# Patient Record
Sex: Female | Born: 1970
Health system: Southern US, Community
[De-identification: ages and names within clinical notes are randomized; demographics above are authoritative.]

## PROBLEM LIST (undated history)

## (undated) DIAGNOSIS — J45909 Unspecified asthma, uncomplicated: Secondary | ICD-10-CM

## (undated) DIAGNOSIS — T7840XA Allergy, unspecified, initial encounter: Secondary | ICD-10-CM

## (undated) DIAGNOSIS — J302 Other seasonal allergic rhinitis: Secondary | ICD-10-CM

## (undated) DIAGNOSIS — D573 Sickle-cell trait: Secondary | ICD-10-CM

## (undated) HISTORY — PX: BREAST BIOPSY: SHX20

## (undated) HISTORY — PX: NO PAST SURGERIES: SHX2092

## (undated) HISTORY — DX: Allergy, unspecified, initial encounter: T78.40XA

## (undated) HISTORY — DX: Unspecified asthma, uncomplicated: J45.909

## (undated) HISTORY — DX: Other seasonal allergic rhinitis: J30.2

## (undated) HISTORY — DX: Sickle-cell trait: D57.3

---

## 2020-02-22 ENCOUNTER — Telehealth: Payer: Self-pay | Admitting: Emergency Medicine

## 2020-02-22 NOTE — Telephone Encounter (Signed)
Copied from [place] [phone]. Topic: Appointment Scheduling - Scheduling Inquiry for Clinic >> Feb 22, 2020 12:06 PM Celene Kras wrote: Reason for CRM: Pt called and is requesting to set up a new patient appointment. Please advise .

## 2020-04-05 ENCOUNTER — Ambulatory Visit (INDEPENDENT_AMBULATORY_CARE_PROVIDER_SITE_OTHER): Payer: 59 | Admitting: Emergency Medicine

## 2020-04-05 ENCOUNTER — Telehealth: Payer: Self-pay | Admitting: *Deleted

## 2020-04-05 ENCOUNTER — Other Ambulatory Visit: Payer: Self-pay

## 2020-04-05 ENCOUNTER — Encounter: Payer: Self-pay | Admitting: Emergency Medicine

## 2020-04-05 ENCOUNTER — Encounter: Payer: Self-pay | Admitting: Internal Medicine

## 2020-04-05 VITALS — BP 112/73 | HR 60 | Temp 97.6°F | Resp 16 | Ht 71.0 in | Wt 193.0 lb

## 2020-04-05 DIAGNOSIS — H547 Unspecified visual loss: Secondary | ICD-10-CM

## 2020-04-05 DIAGNOSIS — Z1231 Encounter for screening mammogram for malignant neoplasm of breast: Secondary | ICD-10-CM

## 2020-04-05 DIAGNOSIS — Z7689 Persons encountering health services in other specified circumstances: Secondary | ICD-10-CM

## 2020-04-05 DIAGNOSIS — Z124 Encounter for screening for malignant neoplasm of cervix: Secondary | ICD-10-CM | POA: Diagnosis not present

## 2020-04-05 DIAGNOSIS — Z130 Encounter for screening for diseases of the blood and blood-forming organs and certain disorders involving the immune mechanism: Secondary | ICD-10-CM

## 2020-04-05 DIAGNOSIS — Z1211 Encounter for screening for malignant neoplasm of colon: Secondary | ICD-10-CM

## 2020-04-05 DIAGNOSIS — Z0000 Encounter for general adult medical examination without abnormal findings: Secondary | ICD-10-CM

## 2020-04-05 DIAGNOSIS — Z114 Encounter for screening for human immunodeficiency virus [HIV]: Secondary | ICD-10-CM

## 2020-04-05 DIAGNOSIS — Z13228 Encounter for screening for other metabolic disorders: Secondary | ICD-10-CM

## 2020-04-05 DIAGNOSIS — Z1159 Encounter for screening for other viral diseases: Secondary | ICD-10-CM

## 2020-04-05 DIAGNOSIS — Z13220 Encounter for screening for lipoid disorders: Secondary | ICD-10-CM

## 2020-04-05 DIAGNOSIS — Z1329 Encounter for screening for other suspected endocrine disorder: Secondary | ICD-10-CM

## 2020-04-05 DIAGNOSIS — Z1321 Encounter for screening for nutritional disorder: Secondary | ICD-10-CM

## 2020-04-05 NOTE — Progress Notes (Signed)
Elizabeth Boyer 49 y.o.   Chief Complaint  Patient presents with  . Establish Care    per pt just moved here 2 months from [facility]  . family history of htn    HISTORY OF PRESENT ILLNESS: This is a 49 y.o. female first visit to this office, here to establish care with me. Healthy female with a healthy lifestyle.  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  Recently moved to this area from [place]. No complaints or medical concerns today.  HPI   Prior to Admission medications   Not on File    Not on File  There are no problems to display for this patient.   History reviewed. No pertinent past medical history.  History reviewed. No pertinent surgical history.  Social History   Socioeconomic History  . Marital status: Legally Separated    Spouse name: Not on file  . Number of children: Not on file  . Years of education: Not on file  . Highest education level: Not on file  Occupational History  . Not on file  Tobacco Use  . Smoking status: Never Smoker  . Smokeless tobacco: Never Used  Substance and Sexual Activity  . Alcohol use: Not on file    Comment: once a month  . Drug use: Never  . Sexual activity: Not on file  Other Topics Concern  . Not on file  Social History Narrative  . Not on file   Social Determinants of Health   Financial Resource Strain:   . Difficulty of Paying Living Expenses:   Food Insecurity:   . Worried About Charity fundraiser in the Last Year:   . Arboriculturist in the Last Year:   Transportation Needs:   . Film/video editor (Medical):   Marland Kitchen Lack of Transportation (Non-Medical):   Physical Activity:   . Days of Exercise per Week:   . Minutes of Exercise per Session:   Stress:   . Feeling of Stress :   Social Connections:   . Frequency of Communication with Friends and Family:   . Frequency of Social Gatherings with Friends and Family:   . Attends Religious Services:   . Active Member of Clubs or Organizations:   . Attends  Archivist Meetings:   Marland Kitchen Marital Status:   Intimate Partner Violence:   . Fear of Current or Ex-Partner:   . Emotionally Abused:   Marland Kitchen Physically Abused:   . Sexually Abused:     History reviewed. No pertinent family history.   Review of Systems  Constitutional: Negative.  Negative for chills and fever.  HENT: Negative.  Negative for congestion and sore throat.   Respiratory: Negative.  Negative for cough and shortness of breath.   Cardiovascular: Negative.  Negative for chest pain and palpitations.  Gastrointestinal: Negative.  Negative for abdominal pain, diarrhea, nausea and vomiting.  Genitourinary: Negative.  Negative for dysuria and hematuria.  Musculoskeletal: Negative.  Negative for back pain, myalgias and neck pain.  Skin: Negative.  Negative for rash.  Neurological: Negative.  Negative for dizziness and headaches.  Endo/Heme/Allergies: Positive for environmental allergies.  All other systems reviewed and are negative.  Today's Vitals   04/05/20 0819  BP: 112/73  Pulse: 60  Resp: 16  Temp: 97.6 F (36.4 C)  TempSrc: Temporal  SpO2: 98%  Weight: 193 lb (87.5 kg)  Height: 5\' 11"  (1.803 m)   Body mass index is 26.92 kg/m.   Physical Exam Vitals reviewed.  Constitutional:      Appearance: Normal appearance.  HENT:     Head: Normocephalic.  Eyes:     Extraocular Movements: Extraocular movements intact.     Conjunctiva/sclera: Conjunctivae normal.     Pupils: Pupils are equal, round, and reactive to light.  Cardiovascular:     Rate and Rhythm: Normal rate and regular rhythm.     Pulses: Normal pulses.     Heart sounds: Normal heart sounds.  Pulmonary:     Effort: Pulmonary effort is normal.     Breath sounds: Normal breath sounds.  Musculoskeletal:        General: Normal range of motion.     Cervical back: Normal range of motion and neck supple.  Skin:    General: Skin is warm and dry.     Capillary Refill: Capillary refill takes less than 2  seconds.  Neurological:     General: No focal deficit present.     Mental Status: She is alert and oriented to person, place, and time.  Psychiatric:        Mood and Affect: Mood normal.        Behavior: Behavior normal.      ASSESSMENT & PLAN: Elizabeth Boyer was seen today for establish care and family history of htn.  Diagnoses and all orders for this visit:  Routine general medical examination at a health care facility  Encounter to establish care  Colon cancer screening -     Ambulatory referral to Gastroenterology -     Cologuard  Encounter for screening mammogram for malignant neoplasm of breast -     MM Digital Screening; Future  Cervical cancer screening -     Ambulatory referral to Obstetrics / Gynecology  Screening for deficiency anemia -     CBC with Differential/Platelet  Screening for lipoid disorders -     Lipid panel  Screening for endocrine, nutritional, metabolic and immunity disorder -     Comprehensive metabolic panel -     Hemoglobin A1c  Need for hepatitis C screening test -     Hepatitis C antibody  Screening for HIV (human immunodeficiency virus) -     HIV Antibody (routine testing w rflx)  Problems with sight -     Ambulatory referral to Optometry    Patient Instructions       If you have lab work done today you will be contacted with your lab results within the next 2 weeks.  If you have not heard from [place] then please contact us. The fastest way to get your results is to register for My Chart.   IF you received an x-ray today, you will receive an invoice from [facility]. Please contact [facility] at [phone] with questions or concerns regarding your invoice.   IF you received labwork today, you will receive an invoice from [facility]. Please contact [facility] at [phone] with questions or concerns regarding your invoice.   Our billing staff will not be able to assist you with questions regarding bills from these  companies.  You will be contacted with the lab results as soon as they are available. The fastest way to get your results is to activate your My Chart account. Instructions are located on the last page of this paperwork. If you have not heard from [place] regarding the results in 2 weeks, please contact this office.     Health Maintenance, Female Adopting a healthy lifestyle and getting preventive care are important in promoting health and  wellness. Ask your health care provider about:  The right schedule for you to have regular tests and exams.  Things you can do on your own to prevent diseases and keep yourself healthy. What should I know about diet, weight, and exercise? Eat a healthy diet   Eat a diet that includes plenty of vegetables, fruits, low-fat dairy products, and lean protein.  Do not eat a lot of foods that are high in solid fats, added sugars, or sodium. Maintain a healthy weight Body mass index (BMI) is used to identify weight problems. It estimates body fat based on height and weight. Your health care provider can help determine your BMI and help you achieve or maintain a healthy weight. Get regular exercise Get regular exercise. This is one of the most important things you can do for your health. Most adults should:  Exercise for at least 150 minutes each week. The exercise should increase your heart rate and make you sweat (moderate-intensity exercise).  Do strengthening exercises at least twice a week. This is in addition to the moderate-intensity exercise.  Spend less time sitting. Even light physical activity can be beneficial. Watch cholesterol and blood lipids Have your blood tested for lipids and cholesterol at 51 years of age, then have this test every 5 years. Have your cholesterol levels checked more often if:  Your lipid or cholesterol levels are high.  You are older than 51 years of age.  You are at high risk for heart disease. What should I know about  cancer screening? Depending on your health history and family history, you may need to have cancer screening at various ages. This may include screening for:  Breast cancer.  Cervical cancer.  Colorectal cancer.  Skin cancer.  Lung cancer. What should I know about heart disease, diabetes, and high blood pressure? Blood pressure and heart disease  High blood pressure causes heart disease and increases the risk of stroke. This is more likely to develop in people who have high blood pressure readings, are of African descent, or are overweight.  Have your blood pressure checked: ? Every 3-5 years if you are 30-54 years of age. ? Every year if you are 77 years old or older. Diabetes Have regular diabetes screenings. This checks your fasting blood sugar level. Have the screening done:  Once every three years after age 60 if you are at a normal weight and have a low risk for diabetes.  More often and at a younger age if you are overweight or have a high risk for diabetes. What should I know about preventing infection? Hepatitis B If you have a higher risk for hepatitis B, you should be screened for this virus. Talk with your health care provider to find out if you are at risk for hepatitis B infection. Hepatitis C Testing is recommended for:  Everyone born from [birth year] through 1965.  Anyone with known risk factors for hepatitis C. Sexually transmitted infections (STIs)  Get screened for STIs, including gonorrhea and chlamydia, if: ? You are sexually active and are younger than 51 years of age. ? You are older than 51 years of age and your health care provider tells you that you are at risk for this type of infection. ? Your sexual activity has changed since you were last screened, and you are at increased risk for chlamydia or gonorrhea. Ask your health care provider if you are at risk.  Ask your health care provider about whether you are at high risk  for HIV. Your health care  provider may recommend a prescription medicine to help prevent HIV infection. If you choose to take medicine to prevent HIV, you should first get tested for HIV. You should then be tested every 3 months for as long as you are taking the medicine. Pregnancy  If you are about to stop having your period (premenopausal) and you may become pregnant, seek counseling before you get pregnant.  Take 400 to 800 micrograms (mcg) of folic acid every day if you become pregnant.  Ask for birth control (contraception) if you want to prevent pregnancy. Osteoporosis and menopause Osteoporosis is a disease in which the bones lose minerals and strength with aging. This can result in bone fractures. If you are 40 years old or older, or if you are at risk for osteoporosis and fractures, ask your health care provider if you should:  Be screened for bone loss.  Take a calcium or vitamin D supplement to lower your risk of fractures.  Be given hormone replacement therapy (HRT) to treat symptoms of menopause. Follow these instructions at home: Lifestyle  Do not use any products that contain nicotine or tobacco, such as cigarettes, e-cigarettes, and chewing tobacco. If you need help quitting, ask your health care provider.  Do not use street drugs.  Do not share needles.  Ask your health care provider for help if you need support or information about quitting drugs. Alcohol use  Do not drink alcohol if: ? Your health care provider tells you not to drink. ? You are pregnant, may be pregnant, or are planning to become pregnant.  If you drink alcohol: ? Limit how much you use to 0-1 drink a day. ? Limit intake if you are breastfeeding.  Be aware of how much alcohol is in your drink. In the [place] one drink equals one 12 oz bottle of beer (355 mL), one 5 oz glass of wine (148 mL), or one 1 oz glass of hard liquor (44 mL). General instructions  Schedule regular health, dental, and eye exams.  Stay current  with your vaccines.  Tell your health care provider if: ? You often feel depressed. ? You have ever been abused or do not feel safe at home. Summary  Adopting a healthy lifestyle and getting preventive care are important in promoting health and wellness.  Follow your health care provider's instructions about healthy diet, exercising, and getting tested or screened for diseases.  Follow your health care provider's instructions on monitoring your cholesterol and blood pressure. This information is not intended to replace advice given to you by your health care provider. Make sure you discuss any questions you have with your health care provider. Document Revised: 09/08/2018 Document Reviewed: 09/08/2018 Elsevier Patient Education  2020 Elsevier Inc.      Agustina Caroli, MD [facility]

## 2020-04-05 NOTE — Patient Instructions (Addendum)
   If you have lab work done today you will be contacted with your lab results within the next 2 weeks.  If you have not heard from us then please contact us. The fastest way to get your results is to register for My Chart.   IF you received an x-ray today, you will receive an invoice from [facility]. Please contact [facility] at [phone] with questions or concerns regarding your invoice.   IF you received labwork today, you will receive an invoice from [facility]. Please contact [facility] at [phone] with questions or concerns regarding your invoice.   Our billing staff will not be able to assist you with questions regarding bills from these companies.  You will be contacted with the lab results as soon as they are available. The fastest way to get your results is to activate your My Chart account. Instructions are located on the last page of this paperwork. If you have not heard from us regarding the results in 2 weeks, please contact this office.       Health Maintenance, Female Adopting a healthy lifestyle and getting preventive care are important in promoting health and wellness. Ask your health care provider about:  The right schedule for you to have regular tests and exams.  Things you can do on your own to prevent diseases and keep yourself healthy. What should I know about diet, weight, and exercise? Eat a healthy diet   Eat a diet that includes plenty of vegetables, fruits, low-fat dairy products, and lean protein.  Do not eat a lot of foods that are high in solid fats, added sugars, or sodium. Maintain a healthy weight Body mass index (BMI) is used to identify weight problems. It estimates body fat based on height and weight. Your health care provider can help determine your BMI and help you achieve or maintain a healthy weight. Get regular exercise Get regular exercise. This is one of the most important things you can do for your health. Most  adults should:  Exercise for at least 150 minutes each week. The exercise should increase your heart rate and make you sweat (moderate-intensity exercise).  Do strengthening exercises at least twice a week. This is in addition to the moderate-intensity exercise.  Spend less time sitting. Even light physical activity can be beneficial. Watch cholesterol and blood lipids Have your blood tested for lipids and cholesterol at 51 years of age, then have this test every 5 years. Have your cholesterol levels checked more often if:  Your lipid or cholesterol levels are high.  You are older than 51 years of age.  You are at high risk for heart disease. What should I know about cancer screening? Depending on your health history and family history, you may need to have cancer screening at various ages. This may include screening for:  Breast cancer.  Cervical cancer.  Colorectal cancer.  Skin cancer.  Lung cancer. What should I know about heart disease, diabetes, and high blood pressure? Blood pressure and heart disease  High blood pressure causes heart disease and increases the risk of stroke. This is more likely to develop in people who have high blood pressure readings, are of African descent, or are overweight.  Have your blood pressure checked: ? Every 3-5 years if you are 18-39 years of age. ? Every year if you are 40 years old or older. Diabetes Have regular diabetes screenings. This checks your fasting blood sugar level. Have the screening done:  Once   every three years after age 40 if you are at a normal weight and have a low risk for diabetes.  More often and at a younger age if you are overweight or have a high risk for diabetes. What should I know about preventing infection? Hepatitis B If you have a higher risk for hepatitis B, you should be screened for this virus. Talk with your health care provider to find out if you are at risk for hepatitis B infection. Hepatitis  C Testing is recommended for:  Everyone born from 1945 through 1965.  Anyone with known risk factors for hepatitis C. Sexually transmitted infections (STIs)  Get screened for STIs, including gonorrhea and chlamydia, if: ? You are sexually active and are younger than 51 years of age. ? You are older than 51 years of age and your health care provider tells you that you are at risk for this type of infection. ? Your sexual activity has changed since you were last screened, and you are at increased risk for chlamydia or gonorrhea. Ask your health care provider if you are at risk.  Ask your health care provider about whether you are at high risk for HIV. Your health care provider may recommend a prescription medicine to help prevent HIV infection. If you choose to take medicine to prevent HIV, you should first get tested for HIV. You should then be tested every 3 months for as long as you are taking the medicine. Pregnancy  If you are about to stop having your period (premenopausal) and you may become pregnant, seek counseling before you get pregnant.  Take 400 to 800 micrograms (mcg) of folic acid every day if you become pregnant.  Ask for birth control (contraception) if you want to prevent pregnancy. Osteoporosis and menopause Osteoporosis is a disease in which the bones lose minerals and strength with aging. This can result in bone fractures. If you are 65 years old or older, or if you are at risk for osteoporosis and fractures, ask your health care provider if you should:  Be screened for bone loss.  Take a calcium or vitamin D supplement to lower your risk of fractures.  Be given hormone replacement therapy (HRT) to treat symptoms of menopause. Follow these instructions at home: Lifestyle  Do not use any products that contain nicotine or tobacco, such as cigarettes, e-cigarettes, and chewing tobacco. If you need help quitting, ask your health care provider.  Do not use street  drugs.  Do not share needles.  Ask your health care provider for help if you need support or information about quitting drugs. Alcohol use  Do not drink alcohol if: ? Your health care provider tells you not to drink. ? You are pregnant, may be pregnant, or are planning to become pregnant.  If you drink alcohol: ? Limit how much you use to 0-1 drink a day. ? Limit intake if you are breastfeeding.  Be aware of how much alcohol is in your drink. In the [place] one drink equals one 12 oz bottle of beer (355 mL), one 5 oz glass of wine (148 mL), or one 1 oz glass of hard liquor (44 mL). General instructions  Schedule regular health, dental, and eye exams.  Stay current with your vaccines.  Tell your health care provider if: ? You often feel depressed. ? You have ever been abused or do not feel safe at home. Summary  Adopting a healthy lifestyle and getting preventive care are important in promoting health and   wellness.  Follow your health care provider's instructions about healthy diet, exercising, and getting tested or screened for diseases.  Follow your health care provider's instructions on monitoring your cholesterol and blood pressure. This information is not intended to replace advice given to you by your health care provider. Make sure you discuss any questions you have with your health care provider. Document Revised: 09/08/2018 Document Reviewed: 09/08/2018 Elsevier Patient Education  2020 Elsevier Inc.  

## 2020-04-05 NOTE — Telephone Encounter (Signed)
Faxed Cologuard requisition to [facility]. confirmation page 8:52 am.

## 2020-04-06 LAB — CBC WITH DIFFERENTIAL/PLATELET
Basophils Absolute: 0 10*3/uL (ref 0.0–0.2)
Basos: 1 %
EOS (ABSOLUTE): 0.3 10*3/uL (ref 0.0–0.4)
Eos: 6 %
Hematocrit: 37.1 % (ref 34.0–46.6)
Hemoglobin: 12 g/dL (ref 11.1–15.9)
Immature Grans (Abs): 0 10*3/uL (ref 0.0–0.1)
Immature Granulocytes: 0 %
Lymphocytes Absolute: 1.8 10*3/uL (ref 0.7–3.1)
Lymphs: 37 %
MCH: 27.5 pg (ref 26.6–33.0)
MCHC: 32.3 g/dL (ref 31.5–35.7)
MCV: 85 fL (ref 79–97)
Monocytes Absolute: 0.4 10*3/uL (ref 0.1–0.9)
Monocytes: 8 %
Neutrophils Absolute: 2.5 10*3/uL (ref 1.4–7.0)
Neutrophils: 48 %
Platelets: 237 10*3/uL (ref 150–450)
RBC: 4.36 x10E6/uL (ref 3.77–5.28)
RDW: 14.4 % (ref 11.7–15.4)
WBC: 5 10*3/uL (ref 3.4–10.8)

## 2020-04-06 LAB — LIPID PANEL
Chol/HDL Ratio: 3.6 ratio (ref 0.0–4.4)
Cholesterol, Total: 237 mg/dL — ABNORMAL HIGH (ref 100–199)
HDL: 65 mg/dL (ref >39)
LDL Chol Calc (NIH): 156 mg/dL — ABNORMAL HIGH (ref 0–99)
Triglycerides: 91 mg/dL (ref 0–149)
VLDL Cholesterol Cal: 16 mg/dL (ref 5–40)

## 2020-04-06 LAB — HEMOGLOBIN A1C
Est. average glucose Bld gHb Est-mCnc: 114 mg/dL
Hgb A1c MFr Bld: 5.6 % (ref 4.8–5.6)

## 2020-04-06 LAB — COMPREHENSIVE METABOLIC PANEL
ALT: 20 IU/L (ref 0–32)
AST: 16 IU/L (ref 0–40)
Albumin/Globulin Ratio: 1.5 (ref 1.2–2.2)
Albumin: 4.1 g/dL (ref 3.8–4.8)
Alkaline Phosphatase: 61 IU/L (ref 48–121)
BUN/Creatinine Ratio: 12 (ref 9–23)
BUN: 11 mg/dL (ref 6–24)
Bilirubin Total: 0.3 mg/dL (ref 0.0–1.2)
CO2: 26 mmol/L (ref 20–29)
Calcium: 9.5 mg/dL (ref 8.7–10.2)
Chloride: 105 mmol/L (ref 96–106)
Creatinine, Ser: 0.95 mg/dL (ref 0.57–1.00)
GFR calc Af Amer: 81 mL/min/{1.73_m2} (ref >59)
GFR calc non Af Amer: 70 mL/min/{1.73_m2} (ref >59)
Globulin, Total: 2.8 g/dL (ref 1.5–4.5)
Glucose: 94 mg/dL (ref 65–99)
Potassium: 4 mmol/L (ref 3.5–5.2)
Sodium: 142 mmol/L (ref 134–144)
Total Protein: 6.9 g/dL (ref 6.0–8.5)

## 2020-04-06 LAB — HEPATITIS C ANTIBODY: Hep C Virus Ab: <0.1 s/co ratio (ref 0.0–0.9)

## 2020-04-06 LAB — HIV ANTIBODY (ROUTINE TESTING W REFLEX): HIV Screen 4th Generation wRfx: NONREACTIVE

## 2020-04-27 LAB — COLOGUARD: Cologuard: NEGATIVE

## 2020-05-29 ENCOUNTER — Encounter: Payer: 59 | Admitting: Internal Medicine

## 2020-06-22 ENCOUNTER — Telehealth: Payer: Self-pay | Admitting: *Deleted

## 2020-06-22 NOTE — Telephone Encounter (Signed)
Schedule mobile mammogram 06-25-2020 primary care at [facility]

## 2020-06-28 ENCOUNTER — Other Ambulatory Visit: Payer: Self-pay

## 2020-06-28 ENCOUNTER — Ambulatory Visit (INDEPENDENT_AMBULATORY_CARE_PROVIDER_SITE_OTHER): Payer: 59 | Admitting: Obstetrics and Gynecology

## 2020-06-28 ENCOUNTER — Other Ambulatory Visit (HOSPITAL_COMMUNITY)
Admission: RE | Admit: 2020-06-28 | Discharge: 2020-06-28 | Disposition: A | Discharge status: Home / Self Care | Payer: 59 | Source: Ambulatory Visit | Attending: Obstetrics and Gynecology | Admitting: Obstetrics and Gynecology

## 2020-06-28 ENCOUNTER — Encounter: Payer: Self-pay | Admitting: Obstetrics and Gynecology

## 2020-06-28 VITALS — BP 111/79 | HR 64 | Wt 198.0 lb

## 2020-06-28 DIAGNOSIS — R350 Frequency of micturition: Secondary | ICD-10-CM

## 2020-06-28 DIAGNOSIS — Z01419 Encounter for gynecological examination (general) (routine) without abnormal findings: Secondary | ICD-10-CM | POA: Diagnosis not present

## 2020-06-28 NOTE — Progress Notes (Signed)
GYNECOLOGY ANNUAL PREVENTATIVE CARE ENCOUNTER NOTE  History:     Elizabeth Boyer is a 49 y.o. G51P0012 female here for a routine annual gynecologic exam.  Current complaints: none.   Denies abnormal vaginal bleeding, discharge, pelvic pain, problems with intercourse or other gynecologic concerns.   Pt has history of hot flashes, but no longer.  Pt notes urinary frequency for the past 2 weeks, no dysuria.  No nocturia.   Gynecologic History No LMP recorded. (Menstrual status: Irregular Periods).  04/2020 Contraception: none Last Pap: pt does not remember. Pt denies history of abnormal pap Last mammogram: [place] 2013. Results were: found a cyst and pt had biopsy, was benign  Obstetric History OB History  Gravida Para Term Preterm AB Living  3       1 2   SAB TAB Ectopic Multiple Live Births    1     2    # Outcome Date GA Lbr Len/2nd Weight Sex Delivery Anes PTL Lv  3 Gravida           2 Gravida           1 TAB             Past Medical History:  Diagnosis Date  . Seasonal allergies     No past surgical history on file.  Current Outpatient Medications on File Prior to Visit  Medication Sig Dispense Refill  . Multiple Vitamin (MULTIVITAMIN) tablet Take 1 tablet by mouth as needed.     No current facility-administered medications on file prior to visit.    Allergies  Allergen Reactions  . Shellfish Allergy Swelling  . Benadryl [Diphenhydramine] Other (See Comments)    Unable to breath and causes asthma    Social History:  reports that she has never smoked. She has never used smokeless tobacco. She reports that she does not use drugs.  Family History  Problem Relation Age of Onset  . Hypertension Mother     The following portions of the patient's history were reviewed and updated as appropriate: allergies, current medications, past family history, past medical history, past social history, past surgical history and problem list.  Review of Systems Pertinent items  noted in HPI and remainder of comprehensive ROS otherwise negative.  Physical Exam:  BP 111/79   Pulse 64   Wt 198 lb (89.8 kg)   BMI 27.62 kg/m  CONSTITUTIONAL: Well-developed, well-nourished female in no acute distress.  HENT:  Normocephalic, atraumatic, External right and left ear normal. Oropharynx is clear and moist EYES: Conjunctivae and EOM are normal. Pupils are equal, round, and reactive to light. No scleral icterus.  NECK: Normal range of motion, supple, no masses.  Normal thyroid.  SKIN: Skin is warm and dry. No rash noted. Not diaphoretic. No erythema. No pallor. MUSCULOSKELETAL: Normal range of motion. No tenderness.  No cyanosis, clubbing, or edema.  2+ distal pulses. NEUROLOGIC: Alert and oriented to person, place, and time. Normal reflexes, muscle tone coordination.  PSYCHIATRIC: Normal mood and affect. Normal behavior. Normal judgment and thought content. CARDIOVASCULAR: Normal heart rate noted, regular rhythm RESPIRATORY: Clear to auscultation bilaterally. Effort and breath sounds normal, no problems with respiration noted. BREASTS: Symmetric in size. No masses, tenderness, skin changes, nipple drainage, or lymphadenopathy bilaterally. Performed in the presence of a chaperone. ABDOMEN: Soft, no distention noted.  No tenderness, rebound or guarding.  PELVIC: Normal appearing external genitalia and urethral meatus; normal appearing vaginal mucosa and cervix.  No abnormal discharge noted.  Pap smear obtained.  Normal uterine size, uterus very retroflexed and the cervix is anterior  no other palpable masses, no uterine or adnexal tenderness.  Performed in the presence of a chaperone.   Assessment and Plan:    1. Encounter for annual routine gynecological examination Normal annual exam, pt appears to be transitioning well into menopause, she has never had a 12 month unbroken amenorrhea, will continue to follow, check ua and urine culture for urinary frequency - Cytology - PAP(  [place])  Will follow up results of pap smear and manage accordingly. Mammogram scheduled by PCP Routine preventative health maintenance measures emphasized. Please refer to After Visit Summary for other counseling recommendations.      Lynnda Shields, MD, [place] [facility], [place]

## 2020-06-28 NOTE — Patient Instructions (Signed)
Preventing Cervical Cancer Cervical cancer is cancer that grows on the cervix. The cervix is at the bottom of the uterus. It connects the uterus to the vagina. The uterus is where a baby develops during pregnancy. Cancer occurs when cells become abnormal and start to grow out of control. If cervical cancer is not found early, it can spread and become dangerous. Cervical cancer cannot always be prevented, but you can take steps to lower your risk of developing this condition. How can this condition affect me? Cervical cancer grows slowly and may not cause any symptoms at first. Over time, the cancer can grow deep into the cervix tissue and spread to other areas. This may take years, and it may happen without you knowing about it. If it is found early, cervical cancer can be treated effectively. If the cancer has grown deep into your cervix or has spread, it will be more difficult to treat. Most cases of cervical cancer are caused by an STI (sexually transmitted infection) called human papillomavirus (HPV). One way to reduce your risk of cervical cancer is to take steps to avoid infection with the HPV virus. Getting regular Pap tests is also important because this can help identify changes in cells that could lead to cancer. Your chances of getting this disease can also be reduced by making certain lifestyle changes. What can increase my risk? You are more likely to develop this condition if:  You have certain things in your sexual history, such as: ? Having a sexually transmitted viral infection. These include chlamydia and herpes. ? Having more than one sexual partner, or having sex with someone who has more than one sexual partner. ? Not using condoms during sex. ? Having been sexually active before the age of 28.  Your mother took a medicine called diethylstilbestrol (DES) while pregnant with you, causing you to be exposed to this medicine before birth.  Your mother or sister has had cervical  cancer.  You are between the ages of 42-50.  You have or have had certain other medical conditions, such as: ? Previous cancer of the vagina or vulva. ? A weakened body defense system (immune system). ? A history of dysplasia of the cervix.  You use oral contraceptives, also called birth control pills.  You smoke or breathe in secondhand smoke. What actions can I take to prevent cervical cancer? Preventing HPV infection   Ask your health care provider about getting the HPV vaccine. If you are 23 years old or younger, you may need to get this vaccine, which is given in three doses over 6 months. This vaccine protects against the types of HPV that could cause cancer.  Limit the number of people you have sex with. Also avoid having sex with people who have had many sex partners.  Use a latex condom every time you have sex. Getting Pap tests Get Pap tests regularly, starting at age 46. Talk with your health care provider about how often you need these tests. Having regular Pap tests will help identify changes in cells that could lead to cancer. Steps can then be taken to prevent cancer from developing.  Most women who are 57?51 years of age should have a Pap test every 3 years.  Most women who are 50?51 years of age should have a Pap test in combination with an HPV test every 5 years.  Women with a higher risk of cervical cancer, such as those with a weakened immune system or those who were  exposed to DES medicine before birth, may need more frequent testing. Making other lifestyle changes   Do not use any products that contain nicotine or tobacco, such as cigarettes, e-cigarettes, and chewing tobacco. If you need help quitting, ask your health care provider.  Eat a healthy diet that includes at least 5 servings of fruits and vegetables every day.  Lose weight if you are overweight. Where to find support Talk with your health care provider, school nurse, or local health department  for guidance about screening and vaccination. Some children and teens may be able to get the HPV vaccine free of charge through the U.S. government's Vaccines for Children [facility]) program. Other places that provide vaccinations include:  Public health clinics. Check with your local health department.  [place], where you would pay only what you can afford. To find one near you, check this website: http://lyons.com/  [place]. These are part of a program for Medicare and Medicaid patients who live in rural areas. The National Breast and Cervical Cancer Early Detection Program also provides breast and cervical cancer screenings and diagnostic services to low-income, uninsured, and underinsured women. Cervical cancer can be passed down through families. Talk with your health care provider or a genetic counselor to learn more about genetic testing for cancer. Where to find more information Learn more about cervical cancer from:  [employer] of Gynecology: www.acog.org  American Cancer Society: www.cancer.org  Centers for Disease Control and Prevention: http://www.wolf.info/ Contact a health care provider if you have:  Pelvic pain.  Unusual discharge or bleeding from your vagina. Summary  Cervical cancer is cancer that grows on the cervix. The cervix is at the bottom of the uterus.  Ask your health care provider about getting the HPV vaccine.  Be sure to get regular Pap tests as recommended by your health care provider.  See your health care provider right away if you have any pelvic pain or unusual discharge or bleeding from your vagina. This information is not intended to replace advice given to you by your health care provider. Make sure you discuss any questions you have with your health care provider. Document Revised: 04/18/2019 Document Reviewed: 04/18/2019 Elsevier Patient Education  [place]. Pap Test Why am I having this  test? A Pap test, also called a Pap smear, is a screening test to check for signs of:  Cancer of the vagina, cervix, and uterus. The cervix is the lower part of the uterus that opens into the vagina.  Infection.  Changes that may be a sign that cancer is developing (precancerous changes). Women need this test on a regular basis. In general, you should have a Pap test every 3 years until you reach menopause or age 73. Women aged 30-60 may choose to have their Pap test done at the same time as an HPV (human papillomavirus) test every 5 years (instead of every 3 years). Your health care provider may recommend having Pap tests more or less often depending on your medical conditions and past Pap test results. What kind of sample is taken?  Your health care provider will collect a sample of cells from the surface of your cervix. This will be done using a small cotton swab, plastic spatula, or brush. This sample is often collected during a pelvic exam, when you are lying on your back on an exam table with feet in footrests (stirrups). In some cases, fluids (secretions) from the cervix or vagina may also be collected.  How do I prepare for this test?  Be aware of where you are in your menstrual cycle. If you are menstruating on the day of the test, you may be asked to reschedule.  You may need to reschedule if you have a known vaginal infection on the day of the test.  Follow instructions from your health care provider about: ? Changing or stopping your regular medicines. Some medicines can cause abnormal test results, such as digitalis and tetracycline. ? Avoiding douching or taking a bath the day before or the day of the test. Tell a health care provider about:  Any allergies you have.  All medicines you are taking, including vitamins, herbs, eye drops, creams, and over-the-counter medicines.  Any blood disorders you have.  Any surgeries you have had.  Any medical conditions you  have.  Whether you are pregnant or may be pregnant. How are the results reported? Your test results will be reported as either abnormal or normal. A false-positive result can occur. A false positive is incorrect because it means that a condition is present when it is not. A false-negative result can occur. A false negative is incorrect because it means that a condition is not present when it is. What do the results mean? A normal test result means that you do not have signs of cancer of the vagina, cervix, or uterus. An abnormal result may mean that you have:  Cancer. A Pap test by itself is not enough to diagnose cancer. You will have more tests done in this case.  Precancerous changes in your vagina, cervix, or uterus.  Inflammation of the cervix.  An STD (sexually transmitted disease).  A fungal infection.  A parasite infection. Talk with your health care provider about what your results mean. Questions to ask your health care provider Ask your health care provider, or the department that is doing the test:  When will my results be ready?  How will I get my results?  What are my treatment options?  What other tests do I need?  What are my next steps? Summary  In general, women should have a Pap test every 3 years until they reach menopause or age 73.  Your health care provider will collect a sample of cells from the surface of your cervix. This will be done using a small cotton swab, plastic spatula, or brush.  In some cases, fluids (secretions) from the cervix or vagina may also be collected. This information is not intended to replace advice given to you by your health care provider. Make sure you discuss any questions you have with your health care provider. Document Revised: 05/25/2017 Document Reviewed: 05/25/2017 Elsevier Patient Education  [place].

## 2020-06-29 LAB — CYTOLOGY - PAP
Chlamydia: NEGATIVE
Comment: NEGATIVE
Comment: NEGATIVE
Comment: NORMAL
Diagnosis: NEGATIVE
High risk HPV: NEGATIVE
Neisseria Gonorrhea: NEGATIVE

## 2020-06-29 LAB — URINALYSIS
Bilirubin, UA: NEGATIVE
Glucose, UA: NEGATIVE
Ketones, UA: NEGATIVE
Leukocytes,UA: NEGATIVE
Nitrite, UA: NEGATIVE
Protein,UA: NEGATIVE
RBC, UA: NEGATIVE
Specific Gravity, UA: 1.015 (ref 1.005–1.030)
Urobilinogen, Ur: 0.2 mg/dL (ref 0.2–1.0)
pH, UA: 6 (ref 5.0–7.5)

## 2020-06-30 LAB — URINE CULTURE: Organism ID, Bacteria: NO GROWTH

## 2020-07-23 ENCOUNTER — Encounter: Payer: 59 | Admitting: Gastroenterology

## 2020-08-21 ENCOUNTER — Encounter: Payer: 59 | Admitting: Gastroenterology

## 2020-09-10 ENCOUNTER — Ambulatory Visit (AMBULATORY_SURGERY_CENTER): Payer: Self-pay | Admitting: *Deleted

## 2020-09-10 ENCOUNTER — Other Ambulatory Visit: Payer: Self-pay

## 2020-09-10 VITALS — Ht 71.0 in | Wt 203.0 lb

## 2020-09-10 DIAGNOSIS — Z1211 Encounter for screening for malignant neoplasm of colon: Secondary | ICD-10-CM

## 2020-09-10 MED ORDER — PLENVU 140 G PO SOLR: 1.0000 | Freq: Once | ORAL | 0 refills | Status: AC

## 2020-09-10 NOTE — Progress Notes (Signed)
Patient is here in-person for PV. Patient denies any allergies to eggs or soy. Patient denies any problems with anesthesia/sedation. Patient denies any oxygen use at home. Patient denies taking any diet/weight loss medications or blood thinners. Patient is not being treated for MRSA or C-diff. Patient is aware of our care-partner policy and [facility] safety protocol. EMMI education assigned to the patient for the procedure, sent to [place].  203 COVID-19 vaccines completed on 09/09/2020 booster, per patient.   Prep Prescription coupon given to the patient.

## 2020-09-17 ENCOUNTER — Encounter: Payer: Self-pay | Admitting: Gastroenterology

## 2020-10-01 ENCOUNTER — Encounter: Payer: Self-pay | Admitting: Gastroenterology

## 2020-10-01 ENCOUNTER — Ambulatory Visit (AMBULATORY_SURGERY_CENTER): Payer: 59 | Admitting: Gastroenterology

## 2020-10-01 ENCOUNTER — Other Ambulatory Visit: Payer: Self-pay

## 2020-10-01 VITALS — BP 113/69 | HR 65 | Temp 97.3°F | Resp 14 | Ht 71.0 in | Wt 203.0 lb

## 2020-10-01 DIAGNOSIS — K621 Rectal polyp: Secondary | ICD-10-CM

## 2020-10-01 DIAGNOSIS — D120 Benign neoplasm of cecum: Secondary | ICD-10-CM

## 2020-10-01 DIAGNOSIS — D128 Benign neoplasm of rectum: Secondary | ICD-10-CM

## 2020-10-01 DIAGNOSIS — Z1211 Encounter for screening for malignant neoplasm of colon: Secondary | ICD-10-CM | POA: Diagnosis not present

## 2020-10-01 MED ORDER — SODIUM CHLORIDE 0.9 % IV SOLN: 500.0000 mL | Freq: Once | INTRAVENOUS | Status: DC

## 2020-10-01 NOTE — Progress Notes (Signed)
Pt's states no medical or surgical changes since previsit or office visit. 

## 2020-10-01 NOTE — Patient Instructions (Signed)
Read all of the handouts given to you by your recovery room nurse.   Thank-you for choosing us for your healthcare needs today.  YOU HAD AN ENDOSCOPIC PROCEDURE TODAY AT THE [facility]:   Refer to the procedure report that was given to you for any specific questions about what was found during the examination.  If the procedure report does not answer your questions, please call your gastroenterologist to clarify.  If you requested that your care partner not be given the details of your procedure findings, then the procedure report has been included in a sealed envelope for you to review at your convenience later.  YOU SHOULD EXPECT: Some feelings of bloating in the abdomen. Passage of more gas than usual.  Walking can help get rid of the air that was put into your GI tract during the procedure and reduce the bloating. If you had a lower endoscopy (such as a colonoscopy or flexible sigmoidoscopy) you may notice spotting of blood in your stool or on the toilet paper. If you underwent a bowel prep for your procedure, you may not have a normal bowel movement for a few days.  Please Note:  You might notice some irritation and congestion in your nose or some drainage.  This is from the oxygen used during your procedure.  There is no need for concern and it should clear up in a day or so.  SYMPTOMS TO REPORT IMMEDIATELY:   Following lower endoscopy (colonoscopy or flexible sigmoidoscopy):  Excessive amounts of blood in the stool  Significant tenderness or worsening of abdominal pains  Swelling of the abdomen that is new, acute  Fever of 100F or higher   For urgent or emergent issues, a gastroenterologist can be reached at any hour by calling [phone]. Do not use MyChart messaging for urgent concerns.    DIET:  We do recommend a small meal at first, but then you may proceed to your regular diet.  Drink plenty of fluids but you should avoid alcoholic beverages for 24  hours.  ACTIVITY:  You should plan to take it easy for the rest of today and you should NOT DRIVE or use heavy machinery until tomorrow (because of the sedation medicines used during the test).    FOLLOW UP: Our staff will call the number listed on your records 48-72 hours following your procedure to check on you and address any questions or concerns that you may have regarding the information given to you following your procedure. If we do not reach you, we will leave a message.  We will attempt to reach you two times.  During this call, we will ask if you have developed any symptoms of COVID 19. If you develop any symptoms (ie: fever, flu-like symptoms, shortness of breath, cough etc.) before then, please call [phone].  If you test positive for Covid 19 in the 2 weeks post procedure, please call and report this information to us.    If any biopsies were taken you will be contacted by phone or by letter within the next 1-3 weeks.  Please call us at [phone] if you have not heard about the biopsies in 3 weeks.    SIGNATURES/CONFIDENTIALITY: You and/or your care partner have signed paperwork which will be entered into your electronic medical record.  These signatures attest to the fact that that the information above on your After Visit Summary has been reviewed and is understood.  Full responsibility of the confidentiality of this discharge   lies with you and/or your care-partner.

## 2020-10-01 NOTE — Op Note (Signed)
[facility] Patient Name: Elizabeth Boyer Procedure Date: 10/01/2020 1:53 PM MRN: [id number] Endoscopist: Remo Lipps P. Havery Moros , MD Age: 50 Referring MD:  Date of Birth: November 03, 1970 Gender: Female Account #: [id number] Procedure:                Colonoscopy Indications:              Screening for colorectal malignant neoplasm, This                            is the patient's first colonoscopy Medicines:                Monitored Anesthesia Care Procedure:                Pre-Anesthesia Assessment:                           - Prior to the procedure, a History and Physical                            was performed, and patient medications and                            allergies were reviewed. The patient's tolerance of                            previous anesthesia was also reviewed. The risks                            and benefits of the procedure and the sedation                            options and risks were discussed with the patient.                            All questions were answered, and informed consent                            was obtained. Prior Anticoagulants: The patient has                            taken no previous anticoagulant or antiplatelet                            agents. ASA Grade Assessment: II - A patient with                            mild systemic disease. After reviewing the risks                            and benefits, the patient was deemed in                            satisfactory condition to undergo the procedure.  After obtaining informed consent, the colonoscope                            was passed under direct vision. Throughout the                            procedure, the patient's blood pressure, pulse, and                            oxygen saturations were monitored continuously. The                            Olympus PCF-H190DL (#[id number]) Colonoscope was                            introduced through the  anus and advanced to the the                            cecum, identified by appendiceal orifice and                            ileocecal valve. The colonoscopy was performed                            without difficulty. The patient tolerated the                            procedure well. The quality of the bowel                            preparation was good. The ileocecal valve,                            appendiceal orifice, and rectum were photographed. Scope In: 2:18:46 PM Scope Out: 2:43:24 PM Scope Withdrawal Time: 0 hours 21 minutes 34 seconds  Total Procedure Duration: 0 hours 24 minutes 38 seconds  Findings:                 The perianal and digital rectal examinations were                            normal.                           A 5 mm polyp was found in the cecum. The polyp was                            sessile. The polyp was removed with a cold snare.                            Resection and retrieval were complete.                           The hepatic flexure was tortuous / looping.  A 4 mm polyp was found in the rectum. The polyp was                            sessile. The polyp was removed with a cold snare.                            Resection and retrieval were complete.                           Internal hemorrhoids were found during                            retroflexion. The hemorrhoids were small.                           The exam was otherwise without abnormality. Complications:            No immediate complications. Estimated blood loss:                            Minimal. Estimated Blood Loss:     Estimated blood loss was minimal. Impression:               - One 5 mm polyp in the cecum, removed with a cold                            snare. Resected and retrieved.                           - Tortuous colon.                           - One 4 mm polyp in the rectum, removed with a cold                            snare. Resected and  retrieved.                           - Internal hemorrhoids.                           - The examination was otherwise normal. Recommendation:           - Patient has a contact number available for                            emergencies. The signs and symptoms of potential                            delayed complications were discussed with the                            patient. Return to normal activities tomorrow.                            Written discharge instructions  were provided to the                            patient.                           - Resume previous diet.                           - Continue present medications.                           - Await pathology results. Remo Lipps P. Allister Lessley, MD 10/01/2020 2:47:27 PM This report has been signed electronically.

## 2020-10-01 NOTE — Progress Notes (Signed)
Report to PACU, RN, vss, BBS= Clear.  

## 2020-10-01 NOTE — Progress Notes (Signed)
Called to room to assist during endoscopic procedure.  Patient ID and intended procedure confirmed with present staff. Received instructions for my participation in the procedure from the performing physician.  

## 2020-10-03 ENCOUNTER — Telehealth: Payer: Self-pay | Admitting: *Deleted

## 2020-10-03 NOTE — Telephone Encounter (Signed)
  Follow up Call-  Call back number 10/01/2020  Post procedure Call Back phone  # [phone]  Permission to leave phone message Yes     Patient questions:  Do you have a fever, pain , or abdominal swelling? No. Pain Score  0 *  Have you tolerated food without any problems? Yes.    Have you been able to return to your normal activities? Yes.    Do you have any questions about your discharge instructions: Diet   No. Medications  No. Follow up visit  No.  Do you have questions or concerns about your Care? No.  Actions: * If pain score is 4 or above: 1. No action needed, pain <4.Have you developed a fever since your procedure? no  2.   Have you had an respiratory symptoms (SOB or cough) since your procedure? no  3.   Have you tested positive for COVID 19 since your procedure no  4.   Have you had any family members/close contacts diagnosed with the COVID 19 since your procedure?  no   If yes to any of these questions please route to Laverna Peace, RN and Karlton Lemon, RN

## 2020-10-03 NOTE — Telephone Encounter (Signed)
  Follow up Call-  Call back number 10/01/2020  Post procedure Call Back phone  # [phone]  Permission to leave phone message Yes     Patient questions:  Do you have a fever, pain , or abdominal swelling? No. Pain Score  0 *  Have you tolerated food without any problems? Yes.    Have you been able to return to your normal activities? Yes.    Do you have any questions about your discharge instructions: Diet   No. Medications  No. Follow up visit  No.  Do you have questions or concerns about your Care? No.  Actions: * If pain score is 4 or above: 1. No action needed, pain <4.Have you developed a fever since your procedure? no  2.   Have you had an respiratory symptoms (SOB or cough) since your procedure? no  3.   Have you tested positive for COVID 19 since your procedure no  4.   Have you had any family members/close contacts diagnosed with the COVID 19 since your procedure?  no   If yes to any of these questions please route to Tracy Walton, RN and Denise Buckner, RN  

## 2021-06-04 ENCOUNTER — Other Ambulatory Visit: Payer: Self-pay | Admitting: Emergency Medicine

## 2021-06-04 DIAGNOSIS — Z1231 Encounter for screening mammogram for malignant neoplasm of breast: Secondary | ICD-10-CM

## 2021-07-16 ENCOUNTER — Other Ambulatory Visit: Payer: Self-pay

## 2021-07-16 ENCOUNTER — Ambulatory Visit
Admission: RE | Admit: 2021-07-16 | Discharge: 2021-07-16 | Disposition: A | Discharge status: Home / Self Care | Payer: BC Managed Care – PPO | Source: Ambulatory Visit

## 2021-07-16 DIAGNOSIS — Z1231 Encounter for screening mammogram for malignant neoplasm of breast: Secondary | ICD-10-CM

## 2021-07-22 ENCOUNTER — Other Ambulatory Visit: Payer: Self-pay | Admitting: Emergency Medicine

## 2021-07-22 DIAGNOSIS — R928 Other abnormal and inconclusive findings on diagnostic imaging of breast: Secondary | ICD-10-CM

## 2021-10-18 ENCOUNTER — Other Ambulatory Visit: Payer: Self-pay | Admitting: Emergency Medicine

## 2021-10-18 ENCOUNTER — Ambulatory Visit
Admission: RE | Admit: 2021-10-18 | Discharge: 2021-10-18 | Disposition: A | Discharge status: Home / Self Care | Payer: BC Managed Care – PPO | Source: Ambulatory Visit | Attending: Emergency Medicine | Admitting: Emergency Medicine

## 2021-10-18 DIAGNOSIS — R928 Other abnormal and inconclusive findings on diagnostic imaging of breast: Secondary | ICD-10-CM

## 2021-10-18 DIAGNOSIS — R922 Inconclusive mammogram: Secondary | ICD-10-CM | POA: Diagnosis not present

## 2021-10-18 DIAGNOSIS — N6310 Unspecified lump in the right breast, unspecified quadrant: Secondary | ICD-10-CM

## 2021-11-19 ENCOUNTER — Ambulatory Visit
Admission: RE | Admit: 2021-11-19 | Discharge: 2021-11-19 | Disposition: A | Discharge status: Home / Self Care | Payer: BC Managed Care – PPO | Source: Ambulatory Visit | Attending: Emergency Medicine | Admitting: Emergency Medicine

## 2021-11-19 DIAGNOSIS — N6312 Unspecified lump in the right breast, upper inner quadrant: Secondary | ICD-10-CM | POA: Diagnosis not present

## 2021-11-19 DIAGNOSIS — N6011 Diffuse cystic mastopathy of right breast: Secondary | ICD-10-CM | POA: Diagnosis not present

## 2021-11-19 DIAGNOSIS — N6310 Unspecified lump in the right breast, unspecified quadrant: Secondary | ICD-10-CM

## 2022-02-17 ENCOUNTER — Ambulatory Visit: Payer: BC Managed Care – PPO | Admitting: Emergency Medicine

## 2022-02-20 ENCOUNTER — Ambulatory Visit: Payer: BC Managed Care – PPO | Admitting: Emergency Medicine

## 2022-02-20 ENCOUNTER — Encounter: Payer: Self-pay | Admitting: Family Medicine

## 2022-02-20 ENCOUNTER — Ambulatory Visit: Payer: BC Managed Care – PPO | Admitting: Family Medicine

## 2022-02-20 VITALS — BP 110/72 | HR 70 | Temp 97.6°F | Ht 71.0 in | Wt 209.0 lb

## 2022-02-20 DIAGNOSIS — M5431 Sciatica, right side: Secondary | ICD-10-CM | POA: Diagnosis not present

## 2022-02-20 MED ORDER — MELOXICAM 7.5 MG PO TABS: 7.5000 mg | ORAL_TABLET | Freq: Every day | ORAL | 0 refills | Status: DC

## 2022-02-20 NOTE — Progress Notes (Signed)
Subjective:     Patient ID: Elizabeth Boyer, female    DOB: 1971/07/29, 51 y.o.   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 Describes pain as a sharp pain that shoots down her leg starting from her hip.    Leg Pain   Patient is in today for complaints of a 2 month history of right leg pain. Pain starts in her right low back and radiates down the back of her right leg to her right calf.  Pain is mainly when she is seated and driving. Denies injury. Pain resolves after being off of work for a few days.   No numbness, tingling or weakness.   Denies fever, chills, dizziness, chest pain, palpitations, shortness of breath, abdominal pain, N/V/D, urinary symptoms, LE edema.    She drives a truck for work.     Health Maintenance Due  Topic Date Due   Zoster Vaccines- Shingrix (1 of 2) Never done    Past Medical History:  Diagnosis Date   Allergy    Asthma    Seasonal allergies    Sickle cell trait (HCC)     Past Surgical History:  Procedure Laterality Date   BREAST BIOPSY     NO PAST SURGERIES      Family History  Problem Relation Age of Onset   Hypertension Mother    Colon cancer Neg Hx    Colon polyps Neg Hx    Esophageal cancer Neg Hx    Rectal cancer Neg Hx    Stomach cancer Neg Hx     Social History   Socioeconomic History   Marital status: Legally Separated    Spouse name: Not on file   Number of children: Not on file   Years of education: Not on file   Highest education level: Not on file  Occupational History   Not on file  Tobacco Use   Smoking status: Never   Smokeless tobacco: Never  Vaping Use   Vaping Use: Never used  Substance and Sexual Activity   Alcohol use: Yes    Comment: once a month beer   Drug use: Never   Sexual activity: Yes    Partners: Male    Birth control/protection: None  Other Topics Concern   Not on file  Social History Narrative   Not  on file   Social Determinants of Health   Financial Resource Strain: Not on file  Food Insecurity: Not on file  Transportation Needs: Not on file  Physical Activity: Not on file  Stress: Not on file  Social Connections: Not on file  Intimate Partner Violence: Not on file    Outpatient Medications Prior to Visit  Medication Sig Dispense Refill   Cetirizine HCl (ZYRTEC ALLERGY PO) Take by mouth.     Multiple Vitamin (MULTIVITAMIN) tablet Take 1 tablet by mouth as needed.     No facility-administered medications prior to visit.    Allergies  Allergen Reactions   Shellfish Allergy Swelling   Benadryl [Diphenhydramine] Other (See Comments)    Unable to breath and causes asthma    ROS     Objective:    Physical Exam Constitutional:      General: She is not in acute distress.    Appearance: She is not ill-appearing.  Eyes:     Conjunctiva/sclera: Conjunctivae normal.     Pupils: Pupils are equal,  round, and reactive to light.  Cardiovascular:     Rate and Rhythm: Normal rate.  Pulmonary:     Effort: Pulmonary effort is normal.  Musculoskeletal:     Cervical back: Normal, normal range of motion and neck supple.     Thoracic back: Normal.     Lumbar back: Tenderness present. Negative right straight leg raise test and negative left straight leg raise test.     Right lower leg: No edema.     Left lower leg: No edema.     Comments: Tenderness to palpation over right SI joint Pain with flexion, hyperextension and right rotation of her spine. Bilateral LE are neurovascularly intact with equal strength.   Skin:    General: Skin is warm and dry.  Neurological:     General: No focal deficit present.     Mental Status: She is alert and oriented to person, place, and time.     Cranial Nerves: No cranial nerve deficit.     Sensory: No sensory deficit.     Motor: No weakness.     Coordination: Coordination normal.     Gait: Gait normal.  Psychiatric:        Mood and Affect:  Mood normal.    BP 110/72 (BP Location: Left Arm, Patient Position: Sitting, Cuff Size: Large)   Pulse 70   Temp 97.6 F (36.4 C) (Temporal)   Ht '5\' 11"'$  (1.803 m)   Wt 209 lb (94.8 kg)   SpO2 98%   BMI 29.15 kg/m  Wt Readings from Last 3 Encounters:  02/20/22 209 lb (94.8 kg)  10/01/20 203 lb (92.1 kg)  09/10/20 203 lb (92.1 kg)       Assessment & Plan:   Problem List Items Addressed This Visit   None Visit Diagnoses     Sciatica of right side    -  Primary   Relevant Medications   meloxicam (MOBIC) 7.5 MG tablet   Other Relevant Orders   Ambulatory referral to Physical Therapy      Meloxicam prescribed Recommend ice or heat, topical pain medication, stretching and a handout was provided.  I will also refer her to physical therapy for further evaluation and treatment.  I am having Elizabeth Boyer start on meloxicam. I am also having her maintain her multivitamin and Cetirizine HCl (ZYRTEC ALLERGY PO).  Meds ordered this encounter  Medications   meloxicam (MOBIC) 7.5 MG tablet    Sig: Take 1 tablet (7.5 mg total) by mouth daily.    Dispense:  30 tablet    Refill:  0    Order Specific Question:   Supervising Provider    Answer:   Pricilla Holm A [[id number]]

## 2022-02-20 NOTE — Patient Instructions (Addendum)
Take the meloxicam once daily.   You can also add Tylenol arthritis if needed.   Use ice or heat.   You can also try a topical pain medication over the counter.    You will hear from physical therapy to schedule.   Sciatica Rehab Ask your health care provider which exercises are safe for you. Do exercises exactly as told by your health care provider and adjust them as directed. It is normal to feel mild stretching, pulling, tightness, or discomfort as you do these exercises. Stop right away if you feel sudden pain or your pain gets worse. Do not begin these exercises until told by your health care provider. Stretching and range-of-motion exercises These exercises warm up your muscles and joints and improve the movement and flexibility of your hips and back. These exercises also help to relieve pain, numbness, and tingling. Sciatic nerve glide Sit in a chair with your head facing down toward your chest. Place your hands behind your back. Let your shoulders slump forward. Slowly straighten one of your legs while you tilt your head back as if you are looking toward the ceiling. Only straighten your leg as far as you can without making your symptoms worse. Hold this position for __________ seconds. Slowly return to the starting position. Repeat with your other leg. Repeat __________ times. Complete this exercise __________ times a day. Knee to chest with hip adduction and internal rotation  Lie on your back on a firm surface with both legs straight. Bend one of your knees and move it up toward your chest until you feel a gentle stretch in your lower back and buttock. Then, move your knee toward the shoulder that is on the opposite side from your leg. This is hip adduction and internal rotation. Hold your leg in this position by holding on to the front of your knee. Hold this position for __________ seconds. Slowly return to the starting position. Repeat with your other leg. Repeat  __________ times. Complete this exercise __________ times a day. Prone extension on elbows  Lie on your abdomen on a firm surface. A bed may be too soft for this exercise. Prop yourself up on your elbows. Use your arms to help lift your chest up until you feel a gentle stretch in your abdomen and your lower back. This will place some of your body weight on your elbows. If this is uncomfortable, try stacking pillows under your chest. Your hips should stay down, against the surface that you are lying on. Keep your hip and back muscles relaxed. Hold this position for __________ seconds. Slowly relax your upper body and return to the starting position. Repeat __________ times. Complete this exercise __________ times a day. Strengthening exercises These exercises build strength and endurance in your back. Endurance is the ability to use your muscles for a long time, even after they get tired. Pelvic tilt This exercise strengthens the muscles that lie deep in the abdomen. Lie on your back on a firm surface. Bend your knees and keep your feet flat on the floor. Tense your abdominal muscles. Tip your pelvis up toward the ceiling and flatten your lower back into the floor. To help with this exercise, you may place a small towel under your lower back and try to push your back into the towel. Hold this position for __________ seconds. Let your muscles relax completely before you repeat this exercise. Repeat __________ times. Complete this exercise __________ times a day. Alternating arm and leg raises  Get on your hands and knees on a firm surface. If you are on a hard floor, you may want to use padding, such as an exercise mat, to cushion your knees. Line up your arms and legs. Your hands should be directly below your shoulders, and your knees should be directly below your hips. Lift your left leg behind you. At the same time, raise your right arm and straighten it in front of you. Do not lift your  leg higher than your hip. Do not lift your arm higher than your shoulder. Keep your abdominal and back muscles tight. Keep your hips facing the ground. Do not arch your back. Keep your balance carefully, and do not hold your breath. Hold this position for __________ seconds. Slowly return to the starting position. Repeat with your right leg and your left arm. Repeat __________ times. Complete this exercise __________ times a day. Posture and body mechanics Good posture and healthy body mechanics can help to relieve stress in your body's tissues and joints. Body mechanics refers to the movements and positions of your body while you do your daily activities. Posture is part of body mechanics. Good posture means: Your spine is in its natural S-curve position (neutral). Your shoulders are pulled back slightly. Your head is not tipped forward. Follow these guidelines to improve your posture and body mechanics in your everyday activities. Standing  When standing, keep your spine neutral and your feet about hip width apart. Keep a slight bend in your knees. Your ears, shoulders, and hips should line up. When you do a task in which you stand in one place for a long time, place one foot up on a stable object that is 2-4 inches (5-10 cm) high, such as a footstool. This helps keep your spine neutral. Sitting  When sitting, keep your spine neutral and keep your feet flat on the floor. Use a footrest, if necessary, and keep your thighs parallel to the floor. Avoid rounding your shoulders, and avoid tilting your head forward. When working at a desk or a computer, keep your desk at a height where your hands are slightly lower than your elbows. Slide your chair under your desk so you are close enough to maintain good posture. When working at a computer, place your monitor at a height where you are looking straight ahead and you do not have to tilt your head forward or downward to look at the  screen. Resting When lying down and resting, avoid positions that are most painful for you. If you have pain with activities such as sitting, bending, stooping, or squatting, lie in a position in which your body does not bend very much. For example, avoid curling up on your side with your arms and knees near your chest (fetal position). If you have pain with activities such as standing for a long time or reaching with your arms, lie with your spine in a neutral position and bend your knees slightly. Try the following positions: Lying on your side with a pillow between your knees. Lying on your back with a pillow under your knees. Lifting  When lifting objects, keep your feet at least shoulder width apart and tighten your abdominal muscles. Bend your knees and hips and keep your spine neutral. It is important to lift using the strength of your legs, not your back. Do not lock your knees straight out. Always ask for help to lift heavy or awkward objects. This information is not intended to replace advice given  to you by your health care provider. Make sure you discuss any questions you have with your health care provider. Document Revised: 01/07/2019 Document Reviewed: 10/07/2018 Elsevier Patient Education  [place].   Sciatica  Sciatica is pain, numbness, weakness, or tingling along the path of the sciatic nerve. The sciatic nerve starts in the lower back and runs down the back of each leg. The nerve controls the muscles in the lower leg and in the back of the knee. It also provides feeling (sensation) to the back of the thigh, the lower leg, and the sole of the foot. Sciatica is a symptom of another medical condition that pinches or puts pressure on the sciatic nerve. Sciatica most often only affects one side of the body. Sciatica usually goes away on its own or with treatment. In some cases, sciatica may come back (recur). What are the causes? This condition is caused by pressure on  the sciatic nerve or pinching of the nerve. This may be the result of: A disk in between the bones of the spine bulging out too far (herniated disk). Age-related changes in the spinal disks. A pain disorder that affects a muscle in the buttock. Extra bone growth near the sciatic nerve. A break (fracture) of the pelvis. Pregnancy. Tumor. This is rare. What increases the risk? The following factors may make you more likely to develop this condition: Playing sports that place pressure or stress on the spine. Having poor strength and flexibility. A history of back injury or surgery. Sitting for long periods of time. Doing activities that involve repetitive bending or lifting. Obesity. What are the signs or symptoms? Symptoms can vary from mild to very severe, and they may include: Any of these problems in the lower back, leg, hip, or buttock: Mild tingling, numbness, or dull aches. Burning sensations. Sharp pains. Numbness in the back of the calf or the sole of the foot. Leg weakness. Severe back pain that makes movement difficult. Symptoms may get worse when you cough, sneeze, or laugh, or when you sit or stand for long periods of time. How is this diagnosed? This condition may be diagnosed based on: Your symptoms and medical history. A physical exam. Blood tests. Imaging tests, such as: X-rays. MRI. CT scan. How is this treated? In many cases, this condition improves on its own without treatment. However, treatment may include: Reducing or modifying physical activity. Exercising and stretching. Icing and applying heat to the affected area. Medicines that help to: Relieve pain and swelling. Relax your muscles. Injections of medicines that help to relieve pain, irritation, and inflammation around the sciatic nerve (steroids). Surgery. Follow these instructions at home: Medicines Take over-the-counter and prescription medicines only as told by your health care provider. Ask  your health care provider if the medicine prescribed to you: Requires you to avoid driving or using heavy machinery. Can cause constipation. You may need to take these actions to prevent or treat constipation: Drink enough fluid to keep your urine pale yellow. Take over-the-counter or prescription medicines. Eat foods that are high in fiber, such as beans, whole grains, and fresh fruits and vegetables. Limit foods that are high in fat and processed sugars, such as fried or sweet foods. Managing pain     If directed, put ice on the affected area. Put ice in a plastic bag. Place a towel between your skin and the bag. Leave the ice on for 20 minutes, 2-3 times a day. If directed, apply heat to the affected  area. Use the heat source that your health care provider recommends, such as a moist heat pack or a heating pad. Place a towel between your skin and the heat source. Leave the heat on for 20-30 minutes. Remove the heat if your skin turns bright red. This is especially important if you are unable to feel pain, heat, or cold. You may have a greater risk of getting burned. Activity  Return to your normal activities as told by your health care provider. Ask your health care provider what activities are safe for you. Avoid activities that make your symptoms worse. Take brief periods of rest throughout the day. When you rest for longer periods, mix in some mild activity or stretching between periods of rest. This will help to prevent stiffness and pain. Avoid sitting for long periods of time without moving. Get up and move around at least one time each hour. Exercise and stretch regularly, as told by your health care provider. Do not lift anything that is heavier than 10 lb (4.5 kg) while you have symptoms of sciatica. When you do not have symptoms, you should still avoid heavy lifting, especially repetitive heavy lifting. When you lift objects, always use proper lifting technique, which  includes: Bending your knees. Keeping the load close to your body. Avoiding twisting. General instructions Maintain a healthy weight. Excess weight puts extra stress on your back. Wear supportive, comfortable shoes. Avoid wearing high heels. Avoid sleeping on a mattress that is too soft or too hard. A mattress that is firm enough to support your back when you sleep may help to reduce your pain. Keep all follow-up visits as told by your health care provider. This is important. Contact a health care provider if: You have pain that: Wakes you up when you are sleeping. Gets worse when you lie down. Is worse than you have experienced in the past. Lasts longer than 4 weeks. You have an unexplained weight loss. Get help right away if: You are not able to control when you urinate or have bowel movements (incontinence). You have: Weakness in your lower back, pelvis, buttocks, or legs that gets worse. Redness or swelling of your back. A burning sensation when you urinate. Summary Sciatica is pain, numbness, weakness, or tingling along the path of the sciatic nerve. This condition is caused by pressure on the sciatic nerve or pinching of the nerve. Sciatica can cause pain, numbness, or tingling in the lower back, legs, hips, and buttocks. Treatment often includes rest, exercise, medicines, and applying ice or heat. This information is not intended to replace advice given to you by your health care provider. Make sure you discuss any questions you have with your health care provider. Document Revised: 10/04/2018 Document Reviewed: 10/04/2018 Elsevier Patient Education  [place].

## 2022-03-19 ENCOUNTER — Other Ambulatory Visit: Payer: Self-pay | Admitting: Family Medicine

## 2022-03-19 DIAGNOSIS — M5431 Sciatica, right side: Secondary | ICD-10-CM

## 2022-03-19 NOTE — Telephone Encounter (Signed)
Called pt to attempt to schedule an appt to see Dr. Mitchel Honour for Meloxicam refill as he has not seen her since 2021. 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 Pt states she cannot schedule appt at this time but is wondering if it is possible to still get refill?

## 2022-03-20 ENCOUNTER — Ambulatory Visit: Payer: BC Managed Care – PPO

## 2022-04-19 ENCOUNTER — Other Ambulatory Visit: Payer: Self-pay | Admitting: Emergency Medicine

## 2022-04-19 DIAGNOSIS — M5431 Sciatica, right side: Secondary | ICD-10-CM

## 2022-09-26 IMAGING — MG MM BREAST LOCALIZATION CLIP
4 series · 4 of 12 positions shown · non-contrast
Comparison: Previous exam(s).

CLINICAL DATA: Evaluate biopsy marker

EXAM:
3D DIAGNOSTIC RIGHT MAMMOGRAM POST ULTRASOUND BIOPSY

[R ML synth-2D]
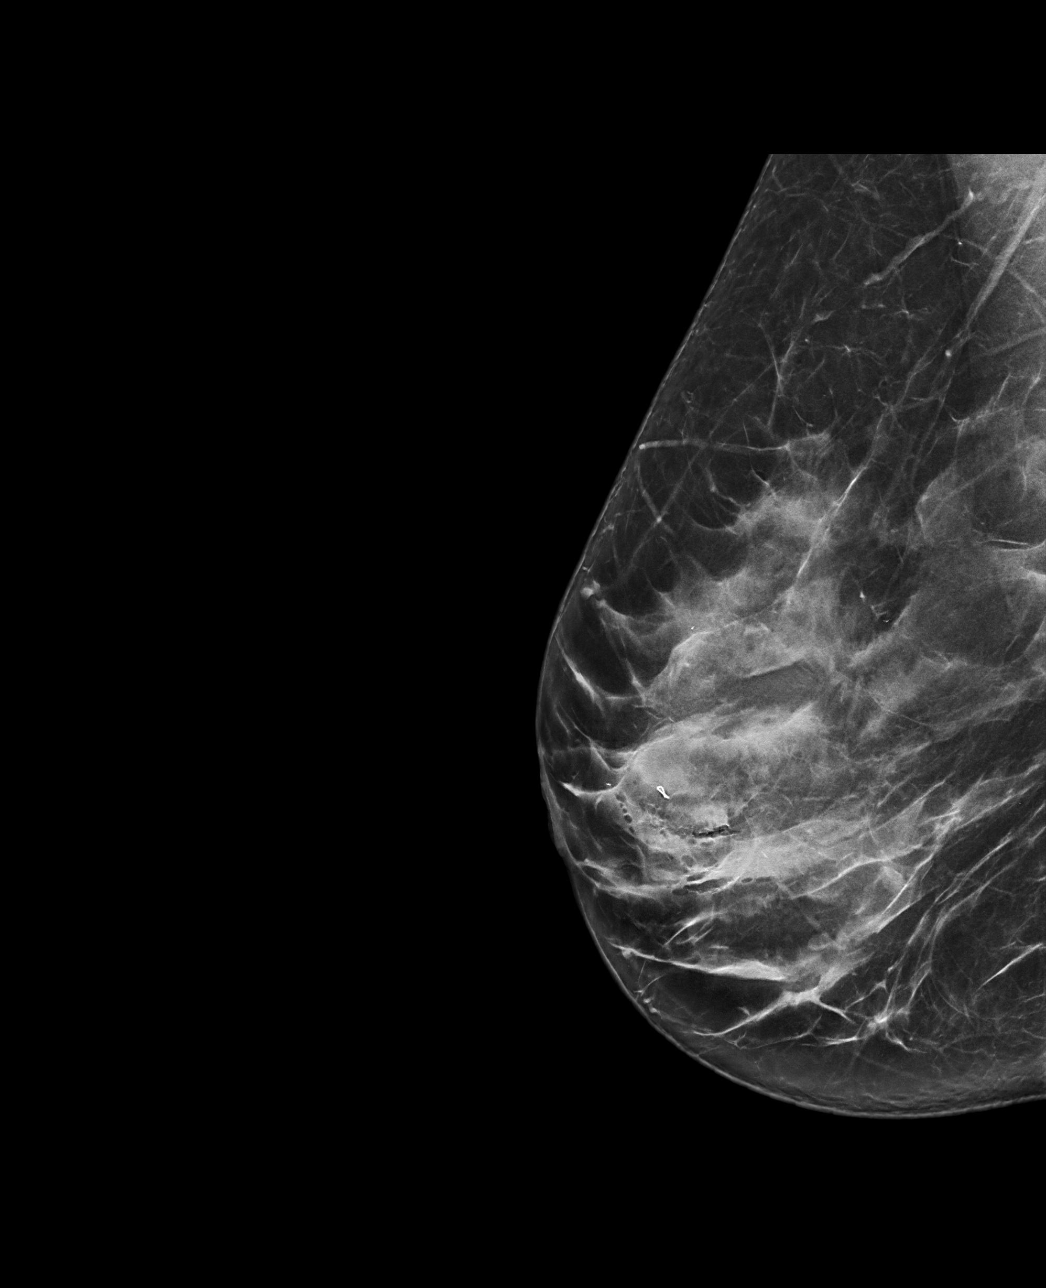

[R CC synth-2D]
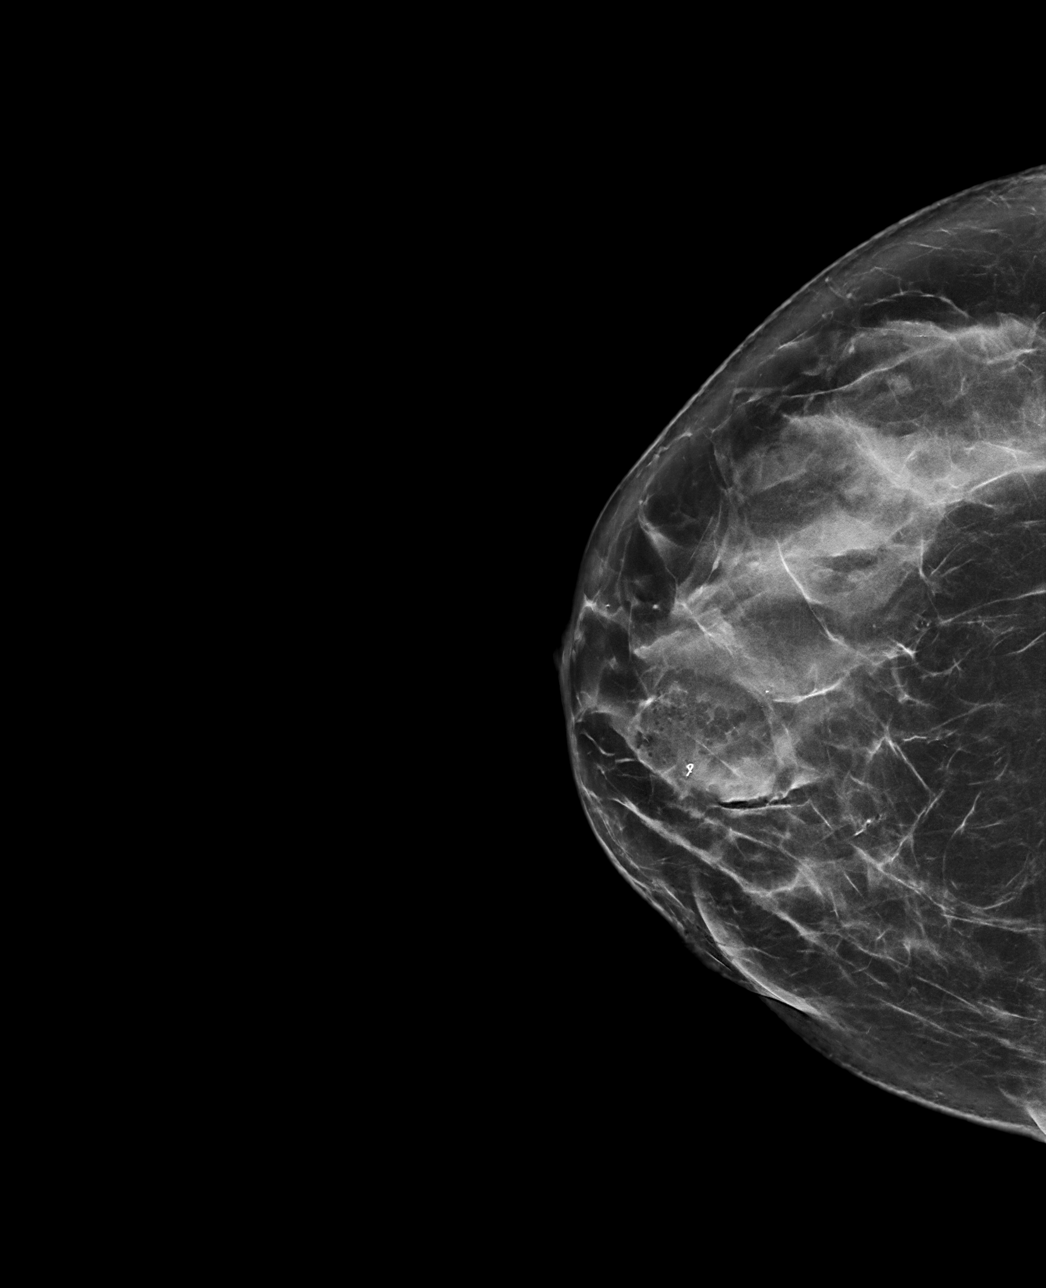

[R CC tomo · tomo slice 41/80.0]
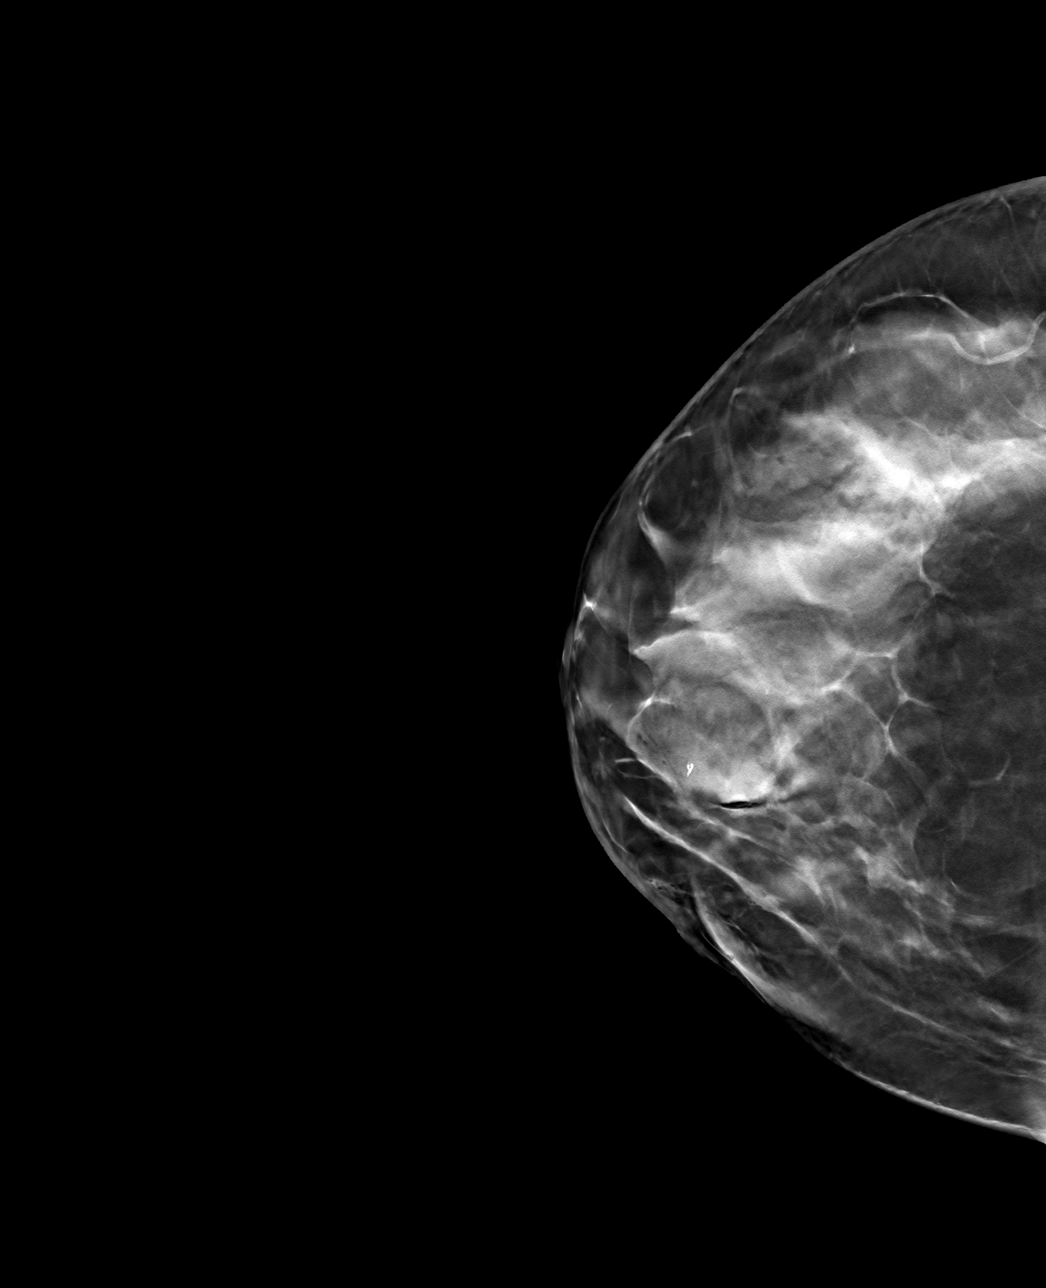

[R ML tomo · tomo slice 38/75.0]
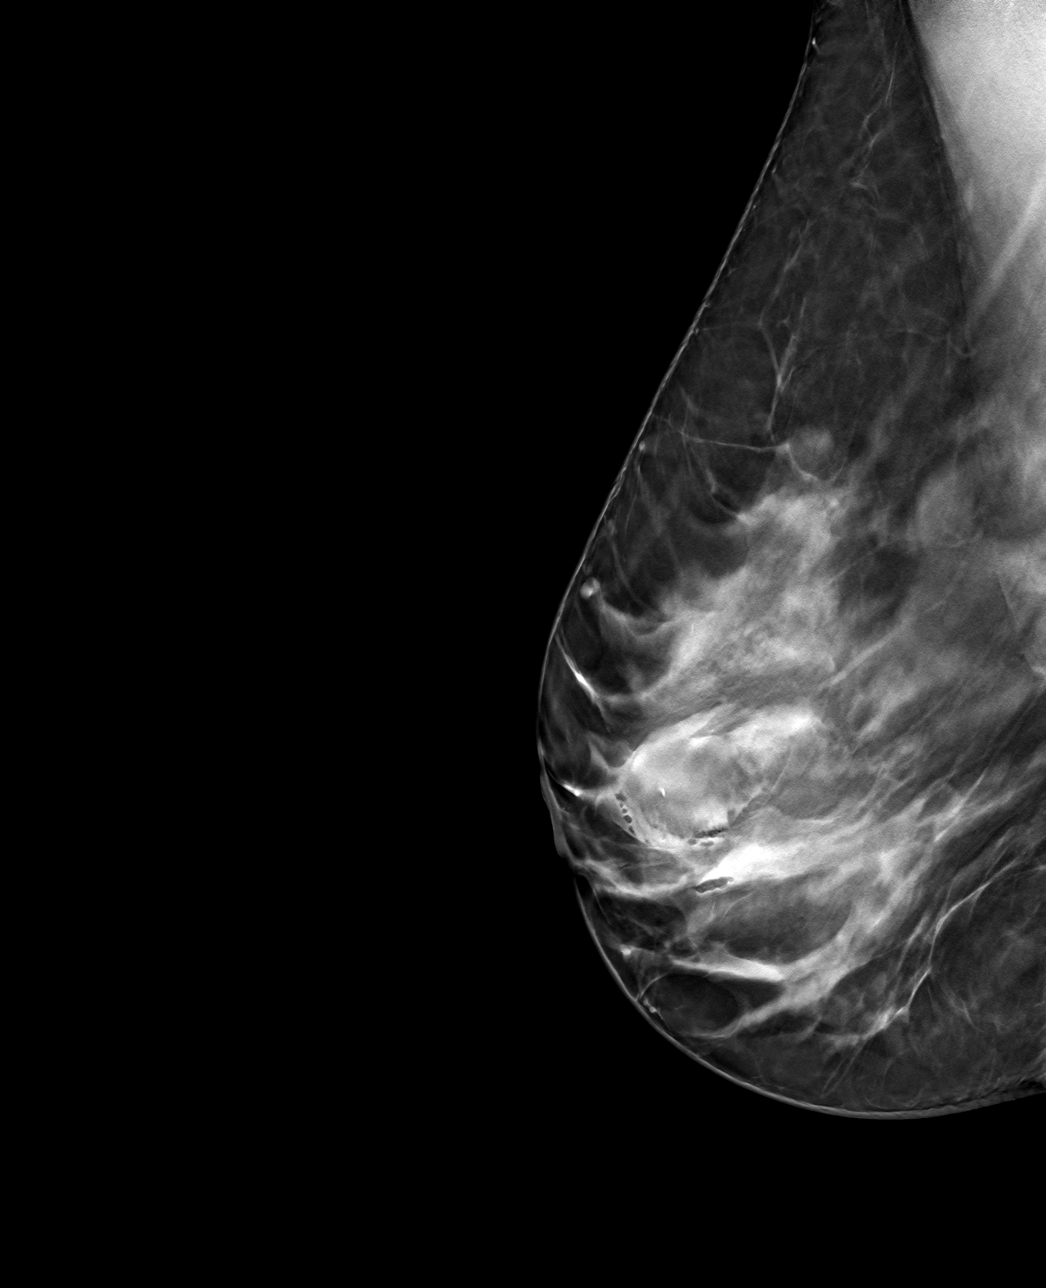

[4 of 12 positions shown; findings below may reference images not displayed]

FINDINGS: 3D Mammographic images were obtained following ultrasound guided
biopsy of a right breast mass. The biopsy marking clip is in
expected position at the site of biopsy.
IMPRESSION: Appropriate positioning of the ribbon shaped biopsy marking clip at
the site of biopsy in the biopsied right breast mass.

Final Assessment: Post Procedure Mammograms for Marker Placement
